# Patient Record
Sex: Female | Born: 1990 | Hispanic: No | Marital: Single | Smoking: Never smoker
Health system: Southern US, Community
[De-identification: ages and names within clinical notes are randomized; demographics above are authoritative.]

---
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
## 2012-01-23 ENCOUNTER — Other Ambulatory Visit (HOSPITAL_COMMUNITY): Payer: Self-pay | Admitting: Physician Assistant

## 2012-01-23 DIAGNOSIS — Z0489 Encounter for examination and observation for other specified reasons: Secondary | ICD-10-CM

## 2012-01-23 LAB — OB RESULTS CONSOLE RPR: RPR: NONREACTIVE

## 2012-01-23 LAB — OB RESULTS CONSOLE ANTIBODY SCREEN: Antibody Screen: NEGATIVE

## 2012-01-23 LAB — OB RESULTS CONSOLE GC/CHLAMYDIA: Gonorrhea: NEGATIVE

## 2012-01-23 LAB — OB RESULTS CONSOLE RUBELLA ANTIBODY, IGM: Rubella: IMMUNE

## 2012-01-24 ENCOUNTER — Ambulatory Visit (HOSPITAL_COMMUNITY)
Admission: RE | Admit: 2012-01-24 | Discharge: 2012-01-24 | Disposition: A | Discharge status: Home / Self Care | Payer: Medicaid Other | Source: Ambulatory Visit | Attending: Physician Assistant | Admitting: Physician Assistant

## 2012-01-24 DIAGNOSIS — O358XX0 Maternal care for other (suspected) fetal abnormality and damage, not applicable or unspecified: Secondary | ICD-10-CM | POA: Insufficient documentation

## 2012-01-24 DIAGNOSIS — Z1389 Encounter for screening for other disorder: Secondary | ICD-10-CM | POA: Insufficient documentation

## 2012-01-24 DIAGNOSIS — O0930 Supervision of pregnancy with insufficient antenatal care, unspecified trimester: Secondary | ICD-10-CM | POA: Insufficient documentation

## 2012-01-24 DIAGNOSIS — Z0489 Encounter for examination and observation for other specified reasons: Secondary | ICD-10-CM

## 2012-01-24 DIAGNOSIS — Z363 Encounter for antenatal screening for malformations: Secondary | ICD-10-CM | POA: Insufficient documentation

## 2012-02-06 LAB — OB RESULTS CONSOLE ABO/RH

## 2012-03-21 ENCOUNTER — Inpatient Hospital Stay (HOSPITAL_COMMUNITY): Payer: Medicaid Other | Admitting: Anesthesiology

## 2012-03-21 ENCOUNTER — Encounter (HOSPITAL_COMMUNITY): Payer: Self-pay

## 2012-03-21 ENCOUNTER — Inpatient Hospital Stay (HOSPITAL_COMMUNITY)
Admission: AD | Admit: 2012-03-21 | Discharge: 2012-03-23 | DRG: 775 | Disposition: A | Discharge status: Home / Self Care | Payer: Medicaid Other | Source: Ambulatory Visit | Attending: Obstetrics & Gynecology | Admitting: Obstetrics & Gynecology

## 2012-03-21 ENCOUNTER — Encounter (HOSPITAL_COMMUNITY): Payer: Self-pay | Admitting: Anesthesiology

## 2012-03-21 LAB — CBC
HCT: 34 % — ABNORMAL LOW (ref 36.0–46.0)
Hemoglobin: 11 g/dL — ABNORMAL LOW (ref 12.0–15.0)
MCH: 25.8 pg — ABNORMAL LOW (ref 26.0–34.0)
MCHC: 32.4 g/dL (ref 30.0–36.0)
RDW: 14.8 % (ref 11.5–15.5)

## 2012-03-21 LAB — TYPE AND SCREEN: DAT, IgG: NEGATIVE

## 2012-03-21 MED ORDER — LACTATED RINGERS IV SOLN
500.0000 mL | Freq: Once | INTRAVENOUS | Status: AC
  Administered 2012-03-21: 19:00:00 via INTRAVENOUS

## 2012-03-21 MED ORDER — LACTATED RINGERS IV SOLN: INTRAVENOUS | Status: DC

## 2012-03-21 MED ORDER — EPHEDRINE 5 MG/ML INJ: 10.0000 mg | INTRAVENOUS | Status: DC | PRN

## 2012-03-21 MED ORDER — OXYTOCIN 40 UNITS IN LACTATED RINGERS INFUSION - SIMPLE MED
62.5000 mL/h | INTRAVENOUS | Status: DC
  Filled 2012-03-21: qty 1000

## 2012-03-21 MED ORDER — LACTATED RINGERS IV SOLN
500.0000 mL | Freq: Once | INTRAVENOUS | Status: DC
Start: 2012-03-21 — End: 2012-03-21

## 2012-03-21 MED ORDER — FENTANYL 2.5 MCG/ML BUPIVACAINE 1/10 % EPIDURAL INFUSION (WH - ANES): 14.0000 mL/h | INTRAMUSCULAR | Status: DC

## 2012-03-21 MED ORDER — CITRIC ACID-SODIUM CITRATE 334-500 MG/5ML PO SOLN: 30.0000 mL | ORAL | Status: DC | PRN

## 2012-03-21 MED ORDER — ONDANSETRON HCL 4 MG/2ML IJ SOLN: 4.0000 mg | Freq: Four times a day (QID) | INTRAMUSCULAR | Status: DC | PRN

## 2012-03-21 MED ORDER — DIPHENHYDRAMINE HCL 50 MG/ML IJ SOLN: 12.5000 mg | INTRAMUSCULAR | Status: DC | PRN

## 2012-03-21 MED ORDER — PHENYLEPHRINE 40 MCG/ML (10ML) SYRINGE FOR IV PUSH (FOR BLOOD PRESSURE SUPPORT)
80.0000 ug | PREFILLED_SYRINGE | INTRAVENOUS | Status: DC | PRN
  Filled 2012-03-21: qty 2
  Filled 2012-03-21: qty 5

## 2012-03-21 MED ORDER — EPHEDRINE 5 MG/ML INJ
10.0000 mg | INTRAVENOUS | Status: DC | PRN
  Filled 2012-03-21: qty 2

## 2012-03-21 MED ORDER — NALBUPHINE SYRINGE 5 MG/0.5 ML: 10.0000 mg | INJECTION | INTRAMUSCULAR | Status: DC | PRN

## 2012-03-21 MED ORDER — PHENYLEPHRINE 40 MCG/ML (10ML) SYRINGE FOR IV PUSH (FOR BLOOD PRESSURE SUPPORT): 80.0000 ug | PREFILLED_SYRINGE | INTRAVENOUS | Status: DC | PRN

## 2012-03-21 MED ORDER — LACTATED RINGERS IV SOLN: 500.0000 mL | INTRAVENOUS | Status: DC | PRN

## 2012-03-21 MED ORDER — EPHEDRINE 5 MG/ML INJ
10.0000 mg | INTRAVENOUS | Status: DC | PRN
  Filled 2012-03-21: qty 4
  Filled 2012-03-21: qty 2

## 2012-03-21 MED ORDER — ACETAMINOPHEN 325 MG PO TABS: 650.0000 mg | ORAL_TABLET | ORAL | Status: DC | PRN

## 2012-03-21 MED ORDER — IBUPROFEN 600 MG PO TABS: 600.0000 mg | ORAL_TABLET | Freq: Four times a day (QID) | ORAL | Status: DC | PRN

## 2012-03-21 MED ORDER — FENTANYL 2.5 MCG/ML BUPIVACAINE 1/10 % EPIDURAL INFUSION (WH - ANES)
14.0000 mL/h | INTRAMUSCULAR | Status: DC
  Filled 2012-03-21: qty 125

## 2012-03-21 MED ORDER — OXYCODONE-ACETAMINOPHEN 5-325 MG PO TABS: 1.0000 | ORAL_TABLET | ORAL | Status: DC | PRN

## 2012-03-21 MED ORDER — OXYTOCIN BOLUS FROM INFUSION
500.0000 mL | INTRAVENOUS | Status: DC
  Filled 2012-03-21 (×41): qty 500

## 2012-03-21 MED ORDER — LIDOCAINE HCL (PF) 1 % IJ SOLN
30.0000 mL | INTRAMUSCULAR | Status: DC | PRN
  Filled 2012-03-21: qty 30

## 2012-03-21 MED ORDER — PHENYLEPHRINE 40 MCG/ML (10ML) SYRINGE FOR IV PUSH (FOR BLOOD PRESSURE SUPPORT)
80.0000 ug | PREFILLED_SYRINGE | INTRAVENOUS | Status: DC | PRN
  Filled 2012-03-21: qty 2

## 2012-03-21 NOTE — Anesthesia Preprocedure Evaluation (Addendum)
Anesthesia Evaluation  Patient identified by MRN, date of birth, ID band Patient awake    Reviewed: Allergy & Precautions, H&P , NPO status , Patient's Chart, lab work & pertinent test results, reviewed documented beta blocker date and time   Airway Mallampati: II TM Distance: >3 FB Neck ROM: full    Dental No notable dental hx.    Pulmonary neg pulmonary ROS,  breath sounds clear to auscultation  Pulmonary exam normal       Cardiovascular negative cardio ROS  Rhythm:regular Rate:Normal     Neuro/Psych negative neurological ROS  negative psych ROS   GI/Hepatic negative GI ROS, Neg liver ROS,   Endo/Other  negative endocrine ROS  Renal/GU negative Renal ROS  negative genitourinary   Musculoskeletal   Abdominal Normal abdominal exam  (+)   Peds  Hematology negative hematology ROS (+)   Anesthesia Other Findings   Reproductive/Obstetrics (+) Pregnancy                           Anesthesia Physical Anesthesia Plan  ASA: II  Anesthesia Plan: Epidural   Post-op Pain Management:    Induction:   Airway Management Planned:   Additional Equipment:   Intra-op Plan:   Post-operative Plan:   Informed Consent: I have reviewed the patients History and Physical, chart, labs and discussed the procedure including the risks, benefits and alternatives for the proposed anesthesia with the patient or authorized representative who has indicated his/her understanding and acceptance.     Plan Discussed with: Anesthesiologist  Anesthesia Plan Comments:         Anesthesia Quick Evaluation  

## 2012-03-21 NOTE — Progress Notes (Signed)
Dahiana Demond is a 21 y.o. G1P0 at [redacted]w[redacted]d by ultrasound admitted for active labor  Subjective:   Objective: BP 118/71  Pulse 94  Temp 97.4 F (36.3 C) (Oral)  Resp 18  Ht 5\' 4"  (1.626 m)  Wt 83.915 kg (185 lb)  BMI 31.76 kg/m2  SpO2 100%      FHT:  FHR: 135 bpm, variability: moderate,  accelerations:  Present,  decelerations:  Absent UC:   regular, every 1 minutes SVE:   9/100/+2 Labs: Lab Results  Component Value Date   WBC 15.3* 03/21/2012   HGB 11.0* 03/21/2012   HCT 34.0* 03/21/2012   MCV 79.8 03/21/2012   PLT 276 03/21/2012    Assessment / Plan: Spontaneous labor, progressing normally  Labor: Progressing normally Preeclampsia:  labs stable Fetal Wellbeing:  Category I Pain Control:  Epidural I/D:  n/a Anticipated MOD:  NSVD  Kuneff, Renee 03/21/2012, 11:02 PM

## 2012-03-21 NOTE — H&P (Signed)
Felicia [name] is a 21 y.o. female G1P0 [redacted]w[redacted]d presenting for contractions every 8 minutes. She denies leaking and gush of fluids. Denies vaginal bleeding. She reports no complications with this pregnancy.  Maternal Medical History:  Reason for admission: Reason for admission: contractions.  Reason for Admission:   nauseaContractions: Onset was 6-12 hours ago.   Frequency: regular.   Duration is approximately 6 minutes.   Perceived severity is strong.    Fetal activity: Perceived fetal activity is normal.   Last perceived fetal movement was within the past hour.      OB History    Grav Para Term Preterm Abortions TAB SAB Ect Mult Living   1              No past medical history on file. No past surgical history on file. Family History: family history is not on file. Social History:  does not have a smoking history on file. She does not have any smokeless tobacco history on file. She reports that she does not drink alcohol or use illicit drugs.   Prenatal Transfer Tool  Maternal Diabetes: No Genetic Screening: Normal Maternal Ultrasounds/Referrals: Normal Fetal Ultrasounds or other Referrals:  None Maternal Substance Abuse:  No Significant Maternal Medications:  None Significant Maternal Lab Results:  None Other Comments:  None  Review of Systems  Constitutional: Negative for fever and chills.  Eyes: Negative for blurred vision and double vision.  Cardiovascular: Negative for chest pain.  Gastrointestinal: Negative for nausea, vomiting and diarrhea.  Skin: Negative for itching and rash.  Neurological: Negative for headaches.    Dilation: 4 Effacement (%): 90 Station: -2 Exam by:: L. McDaniel RN Blood pressure 119/71, pulse 106, temperature 98.1 F (36.7 C), temperature source Oral, resp. rate 18, height 5\' 4"  (1.626 m), weight 83.915 kg (185 lb), SpO2 100.00%. Maternal Exam:  Uterine Assessment: Contraction strength is moderate.  Contraction duration is 2 minutes.  Contraction frequency is regular.   Abdomen: Fetal presentation: vertex  Introitus: Normal vulva. Normal vagina.  Cervix: Cervix evaluated by digital exam.     Fetal Exam Fetal Monitor Review: Pattern: accelerations present and no decelerations.    Fetal State Assessment: Category I - tracings are normal.     Physical Exam  Constitutional: She is oriented to person, place, and time. She appears well-developed and well-nourished. No distress.  HENT:  Head: Normocephalic and atraumatic.  Eyes: EOM are normal. Pupils are equal, round, and reactive to light. Right eye exhibits no discharge. Left eye exhibits no discharge.  Neck: Normal range of motion. Neck supple.  Cardiovascular: Normal rate, regular rhythm, normal heart sounds and intact distal pulses.   No murmur heard. Respiratory: Effort normal and breath sounds normal. No respiratory distress. She has no wheezes. She has no rales.  GI: Soft. Bowel sounds are normal. She exhibits no distension. There is no tenderness. There is no rebound and no guarding.       Gravid   Genitourinary: Vagina normal.  Musculoskeletal: She exhibits no edema and no tenderness.  Neurological: She is alert and oriented to person, place, and time.  Skin: Skin is warm and dry. She is not diaphoretic.  Psychiatric: She has a normal mood and affect.    Prenatal labs: ABO, Rh: A/Negative/-- (09/11 0000) Antibody: Negative (08/28 0000) Rubella: Immune (08/28 0000) RPR: Nonreactive (08/28 0000)  HBsAg: Negative (08/28 0000)  HIV: Non-reactive (08/28 0000)  GBS: Negative (10/03 0000)   Assessment/Plan: 1. Admit to LD with routine orders 2.  Active labor 3. GBS negative 4. Epidural orders given 5. Probable AROM  Kuneff, Renee 03/21/2012, 6:30 PM

## 2012-03-21 NOTE — MAU Note (Signed)
Patient will be triaged in [facility]. Earle Gell, CN notified of triage status.

## 2012-03-21 NOTE — Progress Notes (Signed)
Felicia [name] is a 21 y.o. G1P0 at [redacted]w[redacted]d   Subjective: Comfortable with epidural  Objective: BP 109/71  Pulse 88  Temp 97.4 F (36.3 C) (Oral)  Resp 18  Ht 5\' 4"  (1.626 m)  Wt 83.915 kg (185 lb)  BMI 31.76 kg/m2  SpO2 100%      FHT:  FHR: 130 bpm, variability: moderate,  accelerations:  Present,  decelerations:  Absent UC:   regular, every 2-3 minutes SVE:   Dilation: 7 Effacement (%): 90 Station: +1 Exam by:: Kim SROM with exam for clear fluid; station more like 0 than +1  Labs: Lab Results  Component Value Date   WBC 15.3* 03/21/2012   HGB 11.0* 03/21/2012   HCT 34.0* 03/21/2012   MCV 79.8 03/21/2012   PLT 276 03/21/2012    Assessment / Plan: Active labor- progressing well  Eval cx in 1-2 hours Expectant management   Catriona Dillenbeck 03/21/2012, 10:14 PM

## 2012-03-21 NOTE — Anesthesia Procedure Notes (Addendum)
Epidural Patient location during procedure: OB Start time: 03/21/2012 7:22 PM End time: 03/21/2012 7:40 PM  Staffing Anesthesiologist: Lewie Loron R Performed by: anesthesiologist   Preanesthetic Checklist Completed: patient identified, site marked, surgical consent, pre-op evaluation, timeout performed, IV checked, risks and benefits discussed and monitors and equipment checked  Epidural Patient position: sitting Prep: DuraPrep Patient monitoring: heart rate, continuous pulse ox and blood pressure Approach: midline Injection technique: LOR air  Needle:  Needle type: Tuohy  Needle gauge: 17 G Needle length: 9 cm Needle insertion depth: 7 cm Catheter type: closed end flexible Catheter size: 19 Gauge Catheter at skin depth: 13 cm Test dose: negative  Assessment Sensory level: T8 Events: blood not aspirated, no injection resistance, negative IV test and no paresthesia  Additional Notes Patient tolerated procedure well. Good analgesia post bolus.

## 2012-03-21 NOTE — MAU Note (Signed)
Patient states she is having contractions every 6 minutes, (closer in triage). Denies bleeding but has had a discharge. Reports good fetal movement.

## 2012-03-22 ENCOUNTER — Encounter (HOSPITAL_COMMUNITY): Payer: Self-pay | Admitting: *Deleted

## 2012-03-22 MED ORDER — TETANUS-DIPHTH-ACELL PERTUSSIS 5-2.5-18.5 LF-MCG/0.5 IM SUSP: 0.5000 mL | Freq: Once | INTRAMUSCULAR | Status: DC

## 2012-03-22 MED ORDER — SIMETHICONE 80 MG PO CHEW: 80.0000 mg | CHEWABLE_TABLET | ORAL | Status: DC | PRN

## 2012-03-22 MED ORDER — ZOLPIDEM TARTRATE 5 MG PO TABS: 5.0000 mg | ORAL_TABLET | Freq: Every evening | ORAL | Status: DC | PRN

## 2012-03-22 MED ORDER — BENZOCAINE-MENTHOL 20-0.5 % EX AERO: 1.0000 "application " | INHALATION_SPRAY | CUTANEOUS | Status: DC | PRN

## 2012-03-22 MED ORDER — RHO D IMMUNE GLOBULIN 1500 UNIT/2ML IJ SOLN
300.0000 ug | Freq: Once | INTRAMUSCULAR | Status: AC
  Administered 2012-03-22: 300 ug via INTRAMUSCULAR
  Filled 2012-03-22: qty 2

## 2012-03-22 MED ORDER — OXYCODONE-ACETAMINOPHEN 5-325 MG PO TABS: 1.0000 | ORAL_TABLET | ORAL | Status: DC | PRN

## 2012-03-22 MED ORDER — PRENATAL MULTIVITAMIN CH: 1.0000 | ORAL_TABLET | Freq: Every day | ORAL | Status: DC

## 2012-03-22 MED ORDER — INFLUENZA VIRUS VACC SPLIT PF IM SUSP
0.5000 mL | Freq: Once | INTRAMUSCULAR | Status: AC
  Administered 2012-03-23: 0.5 mL via INTRAMUSCULAR

## 2012-03-22 MED ORDER — IBUPROFEN 100 MG/5ML PO SUSP
600.0000 mg | Freq: Four times a day (QID) | ORAL | Status: DC
  Administered 2012-03-22 – 2012-03-23 (×5): 600 mg via ORAL
  Filled 2012-03-22 (×6): qty 30

## 2012-03-22 MED ORDER — DIBUCAINE 1 % RE OINT: 1.0000 "application " | TOPICAL_OINTMENT | RECTAL | Status: DC | PRN

## 2012-03-22 MED ORDER — IBUPROFEN 600 MG PO TABS: 600.0000 mg | ORAL_TABLET | Freq: Four times a day (QID) | ORAL | Status: DC

## 2012-03-22 MED ORDER — SENNOSIDES-DOCUSATE SODIUM 8.6-50 MG PO TABS
2.0000 | ORAL_TABLET | Freq: Every day | ORAL | Status: DC
  Administered 2012-03-22: 2 via ORAL

## 2012-03-22 MED ORDER — ONDANSETRON HCL 4 MG PO TABS: 4.0000 mg | ORAL_TABLET | ORAL | Status: DC | PRN

## 2012-03-22 MED ORDER — DIPHENHYDRAMINE HCL 25 MG PO CAPS: 25.0000 mg | ORAL_CAPSULE | Freq: Four times a day (QID) | ORAL | Status: DC | PRN

## 2012-03-22 MED ORDER — ONDANSETRON HCL 4 MG/2ML IJ SOLN: 4.0000 mg | INTRAMUSCULAR | Status: DC | PRN

## 2012-03-22 MED ORDER — LANOLIN HYDROUS EX OINT: TOPICAL_OINTMENT | CUTANEOUS | Status: DC | PRN

## 2012-03-22 MED ORDER — COMPLETENATE 29-1 MG PO CHEW
1.0000 | CHEWABLE_TABLET | Freq: Every day | ORAL | Status: DC
  Administered 2012-03-22 – 2012-03-23 (×2): 1 via ORAL
  Filled 2012-03-22 (×2): qty 1

## 2012-03-22 MED ORDER — WITCH HAZEL-GLYCERIN EX PADS: 1.0000 "application " | MEDICATED_PAD | CUTANEOUS | Status: DC | PRN

## 2012-03-22 NOTE — Anesthesia Postprocedure Evaluation (Signed)
  Anesthesia Post-op Note  Patient: Production designer, theatre/television/film  Procedure(s) Performed: * No procedures listed *  Patient Location: PACU and Mother/Baby  Anesthesia Type: Epidural  Level of Consciousness: awake, alert  and oriented  Airway and Oxygen Therapy: Patient Spontanous Breathing   Post-op Assessment: Patient's Cardiovascular Status Stable and Respiratory Function Stable  Post-op Vital Signs: stable  Complications: No apparent anesthesia complications

## 2012-03-23 LAB — RH IG WORKUP (INCLUDES ABO/RH)
ABO/RH(D): A NEG
Fetal Screen: NEGATIVE
Unit division: 0

## 2012-03-23 MED ORDER — IBUPROFEN 100 MG/5ML PO SUSP: 600.0000 mg | Freq: Four times a day (QID) | ORAL | Status: DC

## 2012-03-23 NOTE — Anesthesia Postprocedure Evaluation (Signed)
Anesthesia Post Note  Patient: Production designer, theatre/television/film  Procedure(s) Performed: * No procedures listed *  Anesthesia type: Epidural  Patient location: Mother/Baby  Post pain: Pain level controlled  Post assessment: Post-op Vital signs reviewed  Last Vitals:  Filed Vitals:   03/23/12 0611  BP: 107/70  Pulse: 77  Temp: 36.8 C  Resp: 18    Post vital signs: Reviewed  Level of consciousness:alert  Complications: No apparent anesthesia complications

## 2012-03-23 NOTE — Discharge Summary (Signed)
  Obstetric Discharge Summary Reason for Admission: onset of labor Prenatal Procedures: ultrasound Intrapartum Procedures: spontaneous vaginal delivery Postpartum Procedures: none Complications-Operative and Postpartum: none   Delivery Note At 12:03 AM a viable female was delivered via Vaginal, Spontaneous Delivery (Presentation: Right Occiput Anterior).  APGAR: 9, 9; weight 7 lb 12.5 oz (3530 g).   Placenta status: Intact, Spontaneous.  Cord: 3 vessels with the following complications: None.    Anesthesia: Epidural  Episiotomy: None Lacerations: None Suture Repair: n/a Est. Blood Loss (mL): 200  Mom to postpartum.  Baby to nursery-stable.  [name],Felicia Jupiter 03/23/2012, 8:58 AM     H/H: Lab Results  Component Value Date/Time   HGB 11.0* 03/21/2012  6:20 PM   HCT 34.0* 03/21/2012  6:20 PM      Discharge Diagnoses: Term Pregnancy-delivered  Discharge Information: Date: 12/07/2010 Activity: pelvic rest Diet: routine Medications: Ibuprofen Breast feeding:  No:  Condition: stable Instructions: refer to practice specific booklet Discharge to: home      Medication List     As of 03/23/2012  8:58 AM    TAKE these medications         ibuprofen 100 MG/5ML suspension   Commonly known as: ADVIL,MOTRIN   Take 30 mLs (600 mg total) by mouth every 6 (six) hours.         [name],Felicia Blick 03/23/2012,8:58 AM

## 2012-03-23 NOTE — Clinical Social Work Note (Signed)
CSW consulted with pt.  No concerns with discharge at this time.  Full consult to follow.  [phone]

## 2012-03-23 NOTE — Discharge Summary (Signed)
Attestation of Attending Supervision of Advanced Practitioner (CNM/NP): Evaluation and management procedures were performed by the Advanced Practitioner under my supervision and collaboration.  I have reviewed the Advanced Practitioner's note and chart, and I agree with the management and plan.  Reeya Bound, MD, FACOG Attending Obstetrician & Gynecologist Faculty Practice, [facility]  

## 2012-03-24 MED FILL — Prenatal Vit w/ Fe Fumarate-FA Chew Tab 29-1 MG: ORAL | Qty: 1 | Status: AC

## 2012-03-24 NOTE — Progress Notes (Signed)
Post discharge chart review completed.  

## 2014-03-29 ENCOUNTER — Encounter (HOSPITAL_COMMUNITY): Payer: Self-pay | Admitting: *Deleted

## 2014-07-25 ENCOUNTER — Inpatient Hospital Stay (HOSPITAL_COMMUNITY)
Admission: EM | Admit: 2014-07-25 | Discharge: 2014-07-27 | DRG: 776 | Disposition: A | Discharge status: Home / Self Care | Payer: 59 | Attending: Obstetrics & Gynecology | Admitting: Obstetrics & Gynecology

## 2014-07-25 ENCOUNTER — Encounter (HOSPITAL_COMMUNITY): Payer: Self-pay | Admitting: Cardiology

## 2014-07-25 DIAGNOSIS — O0933 Supervision of pregnancy with insufficient antenatal care, third trimester: Secondary | ICD-10-CM | POA: Diagnosis not present

## 2014-07-25 DIAGNOSIS — Z3A39 39 weeks gestation of pregnancy: Secondary | ICD-10-CM | POA: Diagnosis present

## 2014-07-25 LAB — CBC
HEMATOCRIT: 24.7 % — AB (ref 36.0–46.0)
Hemoglobin: 8.2 g/dL — ABNORMAL LOW (ref 12.0–15.0)
MCH: 25.4 pg — ABNORMAL LOW (ref 26.0–34.0)
MCHC: 33.2 g/dL (ref 30.0–36.0)
MCV: 76.5 fL — ABNORMAL LOW (ref 78.0–100.0)
Platelets: 216 10*3/uL (ref 150–400)
RBC: 3.23 MIL/uL — AB (ref 3.87–5.11)
RDW: 14.3 % (ref 11.5–15.5)
WBC: 11.8 10*3/uL — AB (ref 4.0–10.5)

## 2014-07-25 LAB — DIFFERENTIAL
BASOS ABS: 0 10*3/uL (ref 0.0–0.1)
Basophils Relative: 0 % (ref 0–1)
Eosinophils Absolute: 0 10*3/uL (ref 0.0–0.7)
Eosinophils Relative: 0 % (ref 0–5)
Lymphocytes Relative: 10 % — ABNORMAL LOW (ref 12–46)
Lymphs Abs: 1.1 10*3/uL (ref 0.7–4.0)
Monocytes Absolute: 0.6 10*3/uL (ref 0.1–1.0)
Monocytes Relative: 5 % (ref 3–12)
Neutro Abs: 10.1 10*3/uL — ABNORMAL HIGH (ref 1.7–7.7)
Neutrophils Relative %: 85 % — ABNORMAL HIGH (ref 43–77)

## 2014-07-25 LAB — TYPE AND SCREEN
ABO/RH(D): A NEG
Antibody Screen: NEGATIVE

## 2014-07-25 LAB — RAPID HIV SCREEN (WH-MAU): Rapid HIV Screen: NONREACTIVE

## 2014-07-25 LAB — URINE MICROSCOPIC-ADD ON

## 2014-07-25 LAB — URINALYSIS, ROUTINE W REFLEX MICROSCOPIC
BILIRUBIN URINE: NEGATIVE
GLUCOSE, UA: NEGATIVE mg/dL
Ketones, ur: 15 mg/dL — AB
Nitrite: NEGATIVE
PH: 6 (ref 5.0–8.0)
Protein, ur: 30 mg/dL — AB
SPECIFIC GRAVITY, URINE: 1.025 (ref 1.005–1.030)
Urobilinogen, UA: 2 mg/dL — ABNORMAL HIGH (ref 0.0–1.0)

## 2014-07-25 LAB — RAPID URINE DRUG SCREEN, HOSP PERFORMED
Amphetamines: NOT DETECTED
Barbiturates: NOT DETECTED
Benzodiazepines: NOT DETECTED
COCAINE: NOT DETECTED
Opiates: NOT DETECTED
Tetrahydrocannabinol: NOT DETECTED

## 2014-07-25 LAB — HEPATITIS B SURFACE ANTIGEN: Hepatitis B Surface Ag: NEGATIVE

## 2014-07-25 MED ORDER — DIBUCAINE 1 % RE OINT: 1.0000 "application " | TOPICAL_OINTMENT | RECTAL | Status: DC | PRN

## 2014-07-25 MED ORDER — ONDANSETRON HCL 4 MG/2ML IJ SOLN: 4.0000 mg | INTRAMUSCULAR | Status: DC | PRN

## 2014-07-25 MED ORDER — BENZOCAINE-MENTHOL 20-0.5 % EX AERO: 1.0000 "application " | INHALATION_SPRAY | CUTANEOUS | Status: DC | PRN

## 2014-07-25 MED ORDER — SENNOSIDES-DOCUSATE SODIUM 8.6-50 MG PO TABS
2.0000 | ORAL_TABLET | ORAL | Status: DC
  Administered 2014-07-25: 2 via ORAL
  Filled 2014-07-25 (×2): qty 2

## 2014-07-25 MED ORDER — ZOLPIDEM TARTRATE 5 MG PO TABS
5.0000 mg | ORAL_TABLET | Freq: Every evening | ORAL | Status: DC | PRN
Start: 2014-07-25 — End: 2014-07-27

## 2014-07-25 MED ORDER — DIPHENHYDRAMINE HCL 25 MG PO CAPS: 25.0000 mg | ORAL_CAPSULE | Freq: Four times a day (QID) | ORAL | Status: DC | PRN

## 2014-07-25 MED ORDER — IBUPROFEN 600 MG PO TABS
600.0000 mg | ORAL_TABLET | Freq: Four times a day (QID) | ORAL | Status: DC
  Administered 2014-07-25: 600 mg via ORAL

## 2014-07-25 MED ORDER — IBUPROFEN 100 MG/5ML PO SUSP
600.0000 mg | Freq: Four times a day (QID) | ORAL | Status: DC
  Administered 2014-07-25 – 2014-07-27 (×7): 600 mg via ORAL
  Filled 2014-07-25 (×12): qty 30

## 2014-07-25 MED ORDER — WITCH HAZEL-GLYCERIN EX PADS: 1.0000 "application " | MEDICATED_PAD | CUTANEOUS | Status: DC | PRN

## 2014-07-25 MED ORDER — OXYCODONE-ACETAMINOPHEN 5-325 MG PO TABS: 2.0000 | ORAL_TABLET | ORAL | Status: DC | PRN

## 2014-07-25 MED ORDER — SIMETHICONE 80 MG PO CHEW: 80.0000 mg | CHEWABLE_TABLET | ORAL | Status: DC | PRN

## 2014-07-25 MED ORDER — COMPLETENATE 29-1 MG PO CHEW
1.0000 | CHEWABLE_TABLET | Freq: Every day | ORAL | Status: DC
  Administered 2014-07-26 – 2014-07-27 (×2): 1 via ORAL
  Filled 2014-07-25 (×3): qty 1

## 2014-07-25 MED ORDER — ONDANSETRON HCL 4 MG PO TABS: 4.0000 mg | ORAL_TABLET | ORAL | Status: DC | PRN

## 2014-07-25 MED ORDER — PRENATAL MULTIVITAMIN CH
1.0000 | ORAL_TABLET | Freq: Every day | ORAL | Status: DC
  Administered 2014-07-25: 1 via ORAL

## 2014-07-25 MED ORDER — OXYCODONE-ACETAMINOPHEN 5-325 MG PO TABS: 1.0000 | ORAL_TABLET | ORAL | Status: DC | PRN

## 2014-07-25 MED ORDER — TETANUS-DIPHTH-ACELL PERTUSSIS 5-2.5-18.5 LF-MCG/0.5 IM SUSP
0.5000 mL | Freq: Once | INTRAMUSCULAR | Status: AC
  Administered 2014-07-26: 0.5 mL via INTRAMUSCULAR
  Filled 2014-07-25: qty 0.5

## 2014-07-25 MED ORDER — OXYTOCIN 40 UNITS IN LACTATED RINGERS INFUSION - SIMPLE MED: 62.5000 mL/h | INTRAVENOUS | Status: DC | PRN

## 2014-07-25 MED ORDER — LANOLIN HYDROUS EX OINT: TOPICAL_OINTMENT | CUTANEOUS | Status: DC | PRN

## 2014-07-25 NOTE — ED Provider Notes (Signed)
CSN: [id number]     Arrival date & time 07/25/14  0703 History   First MD Initiated Contact with Patient 07/25/14 [id number]     Chief Complaint  Patient presents with  . Pregnant      (Consider location/radiation/quality/duration/timing/severity/associated sxs/prior Treatment) HPI Comments: Pt states over the last few months she has had abdominal pain, cramping and normal menstraul cycles when about 4-5 hours ago she developed severe lower abdominal cramping and thought she had appendicitis.  Pt upon arrival to the ER delivered a baby in the parking lot.  Currently the pt is in shock and has no complaints.  Patient is a 24 y.o. female presenting with abdominal pain. The history is provided by the patient.  Abdominal Pain Associated symptoms: chills     No past medical history on file. No past surgical history on file. No family history on file. History  Substance Use Topics  . Smoking status: Never Smoker   . Smokeless tobacco: Not on file  . Alcohol Use: No   OB History    Gravida Para Term Preterm AB TAB SAB Ectopic Multiple Living   1 1 1       1      Review of Systems  Constitutional: Positive for chills.  Gastrointestinal: Positive for abdominal pain.      Allergies  Review of patient's allergies indicates no known allergies.  Home Medications   Prior to Admission medications   Medication Sig Start Date End Date Taking? Authorizing Provider  ibuprofen (ADVIL,MOTRIN) 100 MG/5ML suspension Take 30 mLs (600 mg total) by mouth every 6 (six) hours. 03/23/12   Jacklyn ShellFrances Cresenzo-Dishmon, CNM   There were no vitals taken for this visit. Physical Exam  Constitutional: She is oriented to person, place, and time. She appears well-developed and well-nourished. No distress.  HENT:  Head: Normocephalic and atraumatic.  Mouth/Throat: Oropharynx is clear and moist.  Eyes: Conjunctivae and EOM are normal. Pupils are equal, round, and reactive to light.  Neck: Normal range of  motion. Neck supple.  Cardiovascular: Normal rate, regular rhythm and intact distal pulses.   No murmur heard. Pulmonary/Chest: Effort normal and breath sounds normal. No respiratory distress. She has no wheezes. She has no rales.  Abdominal: Soft. She exhibits no distension. There is no tenderness. There is no rebound and no guarding.  Firm palpable uterus in the pelvis  Genitourinary: There is bleeding in the vagina.  Umbilical cord present at introitus  Musculoskeletal: Normal range of motion. She exhibits no edema or tenderness.  Neurological: She is alert and oriented to person, place, and time.  Skin: Skin is warm and dry. No rash noted. No erythema.  Psychiatric: She has a normal mood and affect. Her behavior is normal.  Nursing note and vitals reviewed.   ED Course  Procedures (including critical care time) Labs Review Labs Reviewed - No data to display  Imaging Review No results found.   EKG Interpretation None      MDM   Final diagnoses:  Normal delivery at term    Pt presented due to abdominal pain and upon arrival to the ER delivered what appears to be a full term baby girl in the parking lot.  Pt states did not know she was pregnant was having regular periods and intermittent stomach problems.  She currently appears well and placenta delivered at 7:20.  No excessive vaginal bleeding.  Intermittent uterine massage applied.  Pt transferred to women's.  Prior delivery 2 years ago was normal delivery  and no prenatal problems.  Gwyneth Sprout, MD 07/25/14 [phone]

## 2014-07-25 NOTE — ED Notes (Addendum)
40units of Pitocin in a liter bag started by OB RR RN.

## 2014-07-25 NOTE — ED Notes (Signed)
Placenta Delivered

## 2014-07-25 NOTE — MAU Note (Signed)
Pt arrived to MAU, vitals stable, pitocin almost out.  Fundus even with umbilicus. Baby arrived shortly after. Baby placed skin to skin, breastfeeding initiated.

## 2014-07-25 NOTE — MAU Note (Signed)
Report given to receiving mother baby Public affairs consultanteast nurse, Shanda BumpsJessica.

## 2014-07-25 NOTE — Progress Notes (Signed)
Spoke with Dr. Erin FullingHarraway-Smith. Pt has had no PNC and [facility]. Pt is U/E and firm. Small amt of rubra lochia. 40 units of Pitocin in 1000ml of LR hung and infusing at bolus rate. Infant being assessed by Peds RN. Both are stable. [facility].Infant appears to be term.

## 2014-07-25 NOTE — ED Notes (Signed)
Peds MD present.

## 2014-07-25 NOTE — Progress Notes (Signed)
Report given to Ginger Morris RN, [facility], MAU.

## 2014-07-25 NOTE — ED Notes (Addendum)
Pt to department from parking lot. Mother states she did not know that she was pregnant. States she started having abd pain about 2am. Pt reports she got to the parking lot and delivered a baby. Possible delivery at 0700. Pt brought to the trauma room with baby in arms. Mom states she has been having what she thought to be regular periods. No prenatal care, but mothers second child.

## 2014-07-25 NOTE — H&P (Signed)
Felicia [name] is a 24 y.o. female presenting via EMS following [facility] Reports that she did not know she was pregnant, and received no prenatal care.  [facility]. Reports that she has a small laceration, but prefers no repair. History OB History    Gravida Para Term Preterm AB TAB SAB Ectopic Multiple Living   2 2 1       0 2     History reviewed. No pertinent past medical history. History reviewed. No pertinent past surgical history. Family History: family history is not on file. Social History:  reports that she has never smoked. She does not have any smokeless tobacco history on file. She reports that she does not drink alcohol or use illicit drugs.   Prenatal Transfer Tool  Maternal Diabetes: No Genetic Screening: Declined Maternal Ultrasounds/Referrals: Declined Fetal Ultrasounds or other Referrals:  None Maternal Substance Abuse:  No Significant Maternal Medications:  None Significant Maternal Lab Results:  None Other Comments:  no prenatal care  Review of Systems  Constitutional: Negative.   HENT: Negative.   Eyes: Negative.   Respiratory: Negative.   Cardiovascular: Negative.   Gastrointestinal: Negative.   Genitourinary: Negative.   Musculoskeletal: Negative.   Skin: Negative.   Neurological: Negative.   Endo/Heme/Allergies: Negative.   Psychiatric/Behavioral: Negative.       Blood pressure 103/64, pulse 80, temperature 98.1 F (36.7 C), temperature source Oral, resp. rate 18, SpO2 99 %, unknown if currently breastfeeding. Maternal Exam:  Abdomen: Patient reports no abdominal tenderness. Introitus: Normal vulva. Normal vagina.  1st degree perineal laceration, hemostatic     Physical Exam  Constitutional: She is oriented to person, place, and time. Vital signs are normal. She appears well-developed and well-nourished.  HENT:  Head: Normocephalic.  Eyes: Conjunctivae are normal.  Neck: Normal range  of motion.  Cardiovascular: Normal rate and regular rhythm.   Respiratory: Effort normal.  GI: Soft. There is no tenderness.  Genitourinary: Vagina normal and uterus normal.  Musculoskeletal: Normal range of motion. She exhibits no edema or tenderness.  Neurological: She is alert and oriented to person, place, and time.  Skin: Skin is warm and dry.  Psychiatric: She has a normal mood and affect. Her behavior is normal. Judgment and thought content normal.    Prenatal labs: pending ABO, Rh:   Antibody:   Rubella:   RPR:    HBsAg:    HIV:    GBS:     Assessment/Plan: S/P NSVD 1st degree perineal laceration, hemostatic Admit to Mother/Baby with routine orders Prenatal labs, +UDS   Felicia [name] 07/25/2014, 9:51 AM

## 2014-07-25 NOTE — Progress Notes (Signed)
Pt on room air with spo2 91%. RT will continue to monitor.

## 2014-07-25 NOTE — Progress Notes (Signed)
   07/25/14 0700  Clinical Encounter Type  Visited With Health care provider  Visit Type ED   Chaplain responded to a PERT page at 7:02 AM. Page indicated the patient had given birth in a parking lot. When chaplain arrived several medical team members were present. Chaplain was informed that the patient's family was already in the room and that they were doing fine. No chaplain support needed at this time. Page Merrilyn Puman-Call chaplain if support needed later today. Railynn Ballo, Tommi EmeryBlake R, Chaplain  7:28 AM

## 2014-07-25 NOTE — ED Notes (Signed)
Peds resident at the bedside. Peds code team present.

## 2014-07-25 NOTE — Progress Notes (Signed)
[facility] . Pt is a G2P2. Delivered a baby in the parking lot. Pt is in trauma room B at this time. Infant in warmer being assessed by Peds RN. See flowsheets for fundal checks.Infant looks as though she is at term.

## 2014-07-25 NOTE — Lactation Note (Signed)
This note was copied from the chart of Felicia San JettyBridgette Meinzer. Lactation Consultation Note  Patient Name: Felicia [name] [id number]'s Date: 07/25/2014 Reason for consult: Initial assessment Baby 14 hours of life. Mom states that she has decided to formula-feed baby. States that she did not nurse first child either.   Maternal Data Does the patient have breastfeeding experience prior to this delivery?: No  Feeding Feeding Type: Formula Nipple Type: Slow - flow  LATCH Score/Interventions                      Lactation Tools Discussed/Used     Consult Status Consult Status: Complete    Geralynn OchsWILLIARD, Tameah Mihalko 07/25/2014, 9:27 PM

## 2014-07-25 NOTE — ED Notes (Signed)
[facility] at the bedside °

## 2014-07-26 ENCOUNTER — Encounter (HOSPITAL_COMMUNITY): Payer: Self-pay | Admitting: *Deleted

## 2014-07-26 LAB — RUBELLA SCREEN: RUBELLA: 2.09 {index} (ref >0.99)

## 2014-07-26 LAB — HIV ANTIBODY (ROUTINE TESTING W REFLEX): HIV Screen 4th Generation wRfx: NONREACTIVE

## 2014-07-26 LAB — RPR: RPR Ser Ql: NONREACTIVE

## 2014-07-26 MED ORDER — RHO D IMMUNE GLOBULIN 1500 UNIT/2ML IJ SOSY
300.0000 ug | PREFILLED_SYRINGE | Freq: Once | INTRAMUSCULAR | Status: AC
  Administered 2014-07-26: 300 ug via INTRAVENOUS
  Filled 2014-07-26: qty 2

## 2014-07-26 NOTE — Progress Notes (Signed)
Clinical Social Work Department PSYCHOSOCIAL ASSESSMENT - MATERNAL/CHILD 07/26/2014  Patient:  Felicia [name],Felicia [name]  Account Number:  [id number]  Admit Date:  07/25/2014  Childs Name:   Unnamed at time of assessment   Clinical Social Worker:  Sagal Gayton, CLINICAL SOCIAL WORKER   Date/Time:  07/26/2014 10:30 AM  Date Referred:  07/25/2014   Referral source  Central Nursery     Referred reason  LPNC   Other referral source:    I:  FAMILY / HOME ENVIRONMENT Child's legal guardian:  PARENT  Guardian - Name Guardian - Age Guardian - Address  Felicia [name] 23 [address] [place], [place] [address]   Other household support members/support persons Name Relationship DOB  Felicia [name] DAUGHTER 03/22/2012   MOTHER    FATHER    BROTHER    SISTER    Other support:   MOB endorsed strong family support system.  She stated that she has not yet informed the FOB about the infant, but shared intention to tell him in the upcoming weeks. She discussed belief that he will be supportive.    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 She shared belief that she will be supported by her boss when she tells him of the infant's arrival.   Financial resources:  Private Insurance If Medicaid - County:   Other  WIC  Food Stamps   School / Grade:  N/A Maternity Care Coordinator / Child Services Coordination / Early Interventions:   N/A  Cultural issues impacting care:   None reported   III  STRENGTHS Strengths  Adequate Resources  Home prepared for Child (including basic supplies)  Supportive family/friends   Strength comment:  MOB reported that she had all of her 22 year old daugther's baby supplies, and discussed that her parents are supportive and helping out at home while she is at the hospital.  IV  RISK FACTORS AND CURRENT PROBLEMS Current Problem:   YES   Risk Factor & Current Problem Patient Issue Family Issue Risk Factor / Current Problem Comment  Other - See comment Y N MOB reported that she did not know that she was pregnant until she delivered the infant in the [place] parking lot.  Due to not knowing of the pregnancy, she received NPNC. Infant's UDS is negative and MDS is pending.    V  SOCIAL WORK ASSESSMENT CSW received referral due to MOB not knowing that she was pregnant until she delivered in the parking lot of the [facility].  MOB presented as easily engaged and receptive to the visit. She displayed a full range in affect and presented in pleasant mood.  RN shared that MOB has been interacting appropriately with the infant, and CSW noted that MOB smiles as she discusses the infant's arrival and is able to discuss newborn education that she has received since the infant's birth.    CSW assisted the MOB to reflect upon and construct a narrative secondary to all of the events that have occurred in the past day. MOB openly discussed how she and her mother went to [place] since she was experiencing stomach pain, and she reflected upon the thoughts and feelings when she realized in the parking lot that she was giving birth to a baby (when she felt the infant's head).  The MOB continued to discuss how it was a "shock" for her   to learn that she was giving a birth to a baby since she did not know she was pregnant. The MOB compared her experiences with this pregnancy to her first pregnancy, and she stated that there were no similarities. She shared that with her first pregnancy, she learned of the pregnancy at 3 months, experienced numerous symptoms of pregnancy, including weight gain and movement of the fetus.  She stated that she did not experience any of these symptoms these past 9 months, and continues to be amazed that there is a healthy infant in the crib next to her bed.  MOB expressed gratitude for the infant's health since she  did not receive any prenatal care. She stated that she continues to adjust to the infant's arrival, but stated that she is excited and ready to adjust to her "new normal".  The MOB discussed how her mother and father are supportive of this infant's arrival, and shared how they are at home preparing for their discharge home.   MOB shared that she and her family are working together to prepare for the infant's arrival. She stated that the infant will go to the same pediatrician as her 22 year old.  She also discussed her plans to schedule a WIC appointment and to apply for Medicaid for this infant. The MOB presented with awareness of need to make these arrangement, and discussed that she believes she is prepared and able to make these arrangements since this is her second child. The MOB shared that she has not yet notified her employer of the infant's arrival, but she acknowledged need since she will need to arrange for time off. She stated that she continues to be unsure of how to tell her boss, but she expressed belief that he will be supportive.  Per MOB, the FOB does not know yet know of the infant's arrival, and she shared that it will be a difficult conversation since she has not spoken to him "months".  She discussed that she intends to tell him and believes that he will be supportive.     The MOB denied mental health history or history of PPD. She expressed awareness of need to closely monitor her feelings since she has not had time to emotionally prepare for this infant. CSW provided education on the baby blues and postpartum depression, and she presented as receptive to contacting her MD if she notes symptoms.   MOB verbalized understanding of the hospital drug screen policy due to NPNC.  MOB reported that she used THC one day about a month ago.  She asked appropriate questions related to what occurs if the MDS is positive.  CSW discussed that a CPS report will be made and provided education on what to  expect with CPS involvement.  MOB denied additional questions or concerns, but expressed regret for the one time THC use.  She shared that if she knew that she was pregnant, she would have not used any THC.   MOB expressed appreciation for the visit, and denied additional questions or concerns at this time.  She acknowledged ongoing CSW availability if needed, and agreed to contact CSW as needs arise.     VI SOCIAL WORK PLAN Social Work Plan  Patient/Family Education  Information/Referral to Community Resources  No Further Intervention Required / No Barriers to Discharge   Type of pt/family education:   Postpartum depression  Hospital drug screen policy   If child protective services report - county:  N/A If child protective services   report - date:  N/A Information/referral to community resources comment:   CC4C   Other social work plan:   MOB appears to be coping appropriately secondary to the arrival of the infant.  She will continue to adjust, and will likely requiring additional emotional support as she the postpartum continues. She reported positive support and expressed belief that she will be able to talk with her family about any feelings that may arise as she continues to adjust to the infant's arrival.   CSW to follow up as needed or upon MOB request.  MOB has CSW contact information if needed.  MOB reported isolated event where she used THC about a month ago.  CSW will monitor MDS and will make a CPS report if positive.     

## 2014-07-26 NOTE — Progress Notes (Signed)
Post Partum Day 1 Subjective: no complaints, up ad lib, voiding and tolerating PO  Objective: Blood pressure 108/67, pulse 73, temperature 98 F (36.7 C), temperature source Oral, resp. rate 18, SpO2 100 %, unknown if currently breastfeeding.  Physical Exam:  General: alert, cooperative and no distress Lochia: appropriate Uterine Fundus: firm Incision: n/a DVT Evaluation: No evidence of DVT seen on physical exam. No cords or calf tenderness. No significant calf/ankle edema.   Recent Labs  07/25/14 0956  HGB 8.2*  HCT 24.7*    Assessment/Plan: Plan for discharge tomorrow, Social Work consult and Contraception undecided (IUD vs OCPs vs nexplanon). Bottle feeding.   LOS: 1 day   Beverely Lowdamo, Elena 07/26/2014, 6:38 AM

## 2014-07-26 NOTE — Progress Notes (Signed)
Post Partum Day 2 Subjective: no complaints, up ad lib, voiding, tolerating PO and + flatus  Objective: Blood pressure 108/67, pulse 73, temperature 98 F (36.7 C), temperature source Oral, resp. rate 18, SpO2 100 %, unknown if currently breastfeeding.  Physical Exam:  General: alert, cooperative, appears stated age and no distress Lochia: appropriate Uterine Fundus: firm Incision: n/a DVT Evaluation: No evidence of DVT seen on physical exam. Negative Homan's sign. No cords or calf tenderness. No significant calf/ankle edema.   Recent Labs  07/25/14 0956  HGB 8.2*  HCT 24.7*    Assessment/Plan: Plan for discharge tomorrow   LOS: 1 day   Wyvonnia DuskyLAWSON, Sarinity Dicicco DARLENE 07/26/2014, 6:52 AM

## 2014-07-27 LAB — RH IG WORKUP (INCLUDES ABO/RH)
ABO/RH(D): A NEG
FETAL SCREEN: NEGATIVE
GESTATIONAL AGE(WKS): 39
Unit division: 0

## 2014-07-27 MED ORDER — DOCUSATE SODIUM 100 MG PO CAPS
100.0000 mg | ORAL_CAPSULE | Freq: Two times a day (BID) | ORAL | Status: AC
Start: 2014-07-27 — End: ?

## 2014-07-27 MED ORDER — IBUPROFEN 100 MG/5ML PO SUSP: 600.0000 mg | Freq: Four times a day (QID) | ORAL | Status: AC

## 2014-07-27 MED ORDER — CITRANATAL RX 27-1 MG PO TABS: 1.0000 | ORAL_TABLET | Freq: Every day | ORAL | Status: AC

## 2014-07-27 NOTE — Progress Notes (Signed)
CSW followed up with the MOB in order to continue to assist her to adjust to her "new normal" of having two children.  MOB stated that it continued to become more "real" as time goes on, and she smiled as she reflected upon her discharge and introducing "Skylar" to her 22 year old daughter.  She continues to endorse positive support, and shared belief that she will assistance once she returns home.  CSW continued to explore an discuss mixed emotions that may accompany the transition to the postpartum period as she continues to adjust to the infant since she had limited time to emotionally prepare for this life change.  MOB denied additional questions, concerns, or needs at this time.  MOB expressed appreciation for ongoing CSW support, and was receptive to receiving CSW contact information in the event that needs arise in the postpartum period.   There continue to be no barriers to discharge.  Loleta BooksSarah Shai Rasmussen, LCSW Clinical Social Worker [phone]

## 2014-07-27 NOTE — Discharge Summary (Signed)
Obstetric Discharge Summary Reason for Admission: term delivery in the parking lot of [facility] Prenatal Procedures: none Intrapartum Procedures: spontaneous vaginal delivery Postpartum Procedures: none Complications-Operative and Postpartum: 1st degree perineal laceration that did not require repair  Delivery Note At 7:00 AM a viable female was delivered via Vaginal, Spontaneous Delivery (Presentation: unknown).  APGAR: were not performed as patient was outside of the hospital at 1 and 5 minutes of life. weight 7 lb 15.7 oz (3620 g).   Placenta status: Intact, Spontaneous.  Cord: 3 vessels with the following complications: None  Anesthesia: None  Episiotomy: None Lacerations: 1st degree perineal Suture Repair: none Est. Blood Loss (mL):    Mom to postpartum.  Baby to Couplet care / Skin to Skin.   Hospital Course:  Active Problems:   Vaginal delivery   Felicia [name] is a 24 y.o. G2P1002 s/p NSVD in the hospital parking lot of [facility]. She is otherwise healthy but received no prenatal care during this pregnancy. Her placenta was delivered and a small hemostatic 1st degree perineal laceration was noted. No repair required. She was stabilized and admitted to the postpartum floor.  She has postpartum course that was uncomplicated including no problems with ambulating, PO intake, urination, pain, or bleeding. The pt feels ready to go home and will be discharged with outpatient follow-up in 6 weeks.   Today: No acute events overnight.  Pt denies problems with ambulating, voiding or po intake.  She denies nausea or vomiting.  Pain is well controlled.  She has had flatus. She has had bowel movement.  Lochia Small.  Plan for birth control is  IUD.  Method of Feeding: bottle   H/H: Lab Results  Component Value Date/Time   HGB 8.2* 07/25/2014 09:56 AM   HCT 24.7* 07/25/2014 09:56 AM    Discharge Diagnoses: Term Pregnancy-delivered  Discharge  Information: Date: 07/27/2014 Activity: pelvic rest Diet: routine  Medications: PNV, Ibuprofen and Colace Breast feeding:  No: declines, bottle feeding Condition: stable Instructions: refer to handout Discharge to: home   Discharge Instructions    Activity as tolerated    Complete by:  As directed      Call MD for:  difficulty breathing, headache or visual disturbances    Complete by:  As directed      Call MD for:  extreme fatigue    Complete by:  As directed      Call MD for:  hives    Complete by:  As directed      Call MD for:  persistant dizziness or light-headedness    Complete by:  As directed      Call MD for:  persistant nausea and vomiting    Complete by:  As directed      Call MD for:  severe uncontrolled pain    Complete by:  As directed      Call MD for:  temperature >100.4    Complete by:  As directed      Diet - low sodium heart healthy    Complete by:  As directed      Discharge instructions    Complete by:  As directed   Postpartum follow up in 6 weeks with [facility]     Sexual acrtivity    Complete by:  As directed   Pelvic rest x 6 weeks            Medication List    TAKE these medications  CITRANATAL RX 27-1 MG Tabs  Take 1 tablet by mouth daily.     docusate sodium 100 MG capsule  Commonly known as:  COLACE  Take 1 capsule (100 mg total) by mouth 2 (two) times daily.     ibuprofen 100 MG/5ML suspension  Commonly known as:  ADVIL,MOTRIN  Take 30 mLs (600 mg total) by mouth every 6 (six) hours.         William DaltonMcEachern, Kem Hensen ,MD OB Fellow 07/27/2014,6:30 AM

## 2014-07-27 NOTE — H&P (Signed)
Expand All Collapse All   Felicia [name] is a 24 y.o. female presenting via EMS [facility] Reports that she did not know she was pregnant, and received no prenatal care. [facility] Reports that she has a small laceration, but prefers no repair. History OB History    Gravida Para Term Preterm AB TAB SAB Ectopic Multiple Living   2 2 1       0 2     History reviewed. No pertinent past medical history. History reviewed. No pertinent past surgical history. Family History: family history is not on file. Social History:  reports that she has never smoked. She does not have any smokeless tobacco history on file. She reports that she does not drink alcohol or use illicit drugs.   Prenatal Transfer Tool  Maternal Diabetes: No prenatal care Genetic Screening:No prenatal care Maternal Ultrasounds/Referrals: No prenatal care Fetal Ultrasounds or other Referrals: No prenatal care Maternal Substance Abuse: No prenatal care Significant Maternal Medications: No prenatal care Significant Maternal Lab Results: No prenatal care Other Comments: no prenatal care  Review of Systems  Constitutional: Negative.  HENT: Negative.  Eyes: Negative.  Respiratory: Negative.  Cardiovascular: Negative.  Gastrointestinal: Negative.  Genitourinary: Negative.  Musculoskeletal: Negative.  Skin: Negative.  Neurological: Negative.  Endo/Heme/Allergies: Negative.  Psychiatric/Behavioral: Negative.      Blood pressure 103/64, pulse 80, temperature 98.1 F (36.7 C), temperature source Oral, resp. rate 18, SpO2 99 %, unknown if currently breastfeeding. Maternal Exam:  Abdomen: Patient reports no abdominal tenderness. Introitus: Normal vulva. Normal vagina. 1st degree perineal laceration, hemostatic    Physical Exam  Constitutional: She is oriented to person, place, and time. Vital signs are  normal. She appears well-developed and well-nourished.  HENT:  Head: Normocephalic.  Eyes: Conjunctivae are normal.  Neck: Normal range of motion.  Cardiovascular: Normal rate and regular rhythm.  Respiratory: Effort normal.  GI: Soft. There is no tenderness.  Genitourinary: Vagina normal and uterus normal.  Musculoskeletal: Normal range of motion. She exhibits no edema or tenderness.  Neurological: She is alert and oriented to person, place, and time.  Skin: Skin is warm and dry.  Psychiatric: She has a normal mood and affect. Her behavior is normal. Judgment and thought content normal.    Prenatal labs: pending ABO, Rh:   Antibody:   Rubella:   RPR:    HBsAg:    HIV:    GBS:     Assessment/Plan: S/P NSVD 1st degree perineal laceration, hemostatic Admit to Mother/Baby with routine orders Prenatal labs, +UDS  Ross,Lisa Wynne 07/25/2014, 9:51 AM  Pt seen examined by Zerita Boersarlene Lawson, CNM.   Attestation of Attending Supervision of Advanced Practitioner (CNM/NP): Evaluation and management procedures were performed by the Advanced Practitioner under my supervision and collaboration. I have reviewed the Advanced Practitioner's note and chart, and I agree with the management and plan.  Linnie Delgrande H. 9:16 AM

## 2014-07-28 LAB — HEPATITIS C ANTIBODY (REFLEX): HCV AB: NEGATIVE
